# Patient Record
Sex: Female | Born: 1970 | Hispanic: Yes | Marital: Married | Smoking: Never smoker
Health system: Southern US, Community
[De-identification: ages and names within clinical notes are randomized; demographics above are authoritative.]

## PROBLEM LIST (undated history)

## (undated) HISTORY — PX: ABDOMINAL HYSTERECTOMY: SHX81

---

## 2005-12-26 ENCOUNTER — Emergency Department: Payer: Self-pay | Admitting: Unknown Physician Specialty

## 2006-10-31 ENCOUNTER — Ambulatory Visit: Payer: Self-pay | Admitting: Ophthalmology

## 2011-05-25 LAB — CBC
HCT: 38.2 % (ref 35.0–47.0)
HGB: 12.9 g/dL (ref 12.0–16.0)
MCH: 31.2 pg (ref 26.0–34.0)
RBC: 4.15 10*6/uL (ref 3.80–5.20)
WBC: 10.3 10*3/uL (ref 3.6–11.0)

## 2011-05-25 LAB — COMPREHENSIVE METABOLIC PANEL
Albumin: 4.4 g/dL (ref 3.4–5.0)
Calcium, Total: 8.3 mg/dL — ABNORMAL LOW (ref 8.5–10.1)
Chloride: 100 mmol/L (ref 98–107)
EGFR (African American): >60
Potassium: 3.3 mmol/L — ABNORMAL LOW (ref 3.5–5.1)
SGOT(AST): 22 U/L (ref 15–37)
SGPT (ALT): 16 U/L
Sodium: 137 mmol/L (ref 136–145)
Total Protein: 8.2 g/dL (ref 6.4–8.2)

## 2011-05-25 LAB — URINALYSIS, COMPLETE
Bacteria: NONE SEEN
Blood: NEGATIVE
Glucose,UR: NEGATIVE mg/dL (ref 0–75)
Ketone: NEGATIVE
Leukocyte Esterase: NEGATIVE
Ph: 7 (ref 4.5–8.0)
Protein: NEGATIVE
Specific Gravity: 1 (ref 1.003–1.030)
Squamous Epithelial: <1

## 2011-05-25 LAB — PROTIME-INR
INR: 1
Prothrombin Time: 13.1 secs (ref 11.5–14.7)

## 2011-05-25 LAB — APTT: Activated PTT: 28 secs (ref 23.6–35.9)

## 2011-05-25 LAB — TROPONIN I: Troponin-I: <0.02 ng/mL

## 2011-05-25 LAB — TSH: Thyroid Stimulating Horm: 2.37 u[IU]/mL

## 2011-05-26 ENCOUNTER — Observation Stay: Payer: Self-pay | Admitting: Internal Medicine

## 2011-05-26 LAB — BASIC METABOLIC PANEL
BUN: 7 mg/dL (ref 7–18)
Calcium, Total: 8.6 mg/dL (ref 8.5–10.1)
Chloride: 107 mmol/L (ref 98–107)
Creatinine: 0.66 mg/dL (ref 0.60–1.30)
EGFR (Non-African Amer.): >60
Glucose: 117 mg/dL — ABNORMAL HIGH (ref 65–99)
Potassium: 4 mmol/L (ref 3.5–5.1)
Sodium: 141 mmol/L (ref 136–145)

## 2011-05-26 LAB — LIPID PANEL
Cholesterol: 146 mg/dL (ref 0–200)
HDL Cholesterol: 57 mg/dL (ref 40–60)
Ldl Cholesterol, Calc: 81 mg/dL (ref 0–100)
Triglycerides: 42 mg/dL (ref 0–200)
VLDL Cholesterol, Calc: 8 mg/dL (ref 5–40)

## 2011-05-26 LAB — TROPONIN I
Troponin-I: <0.02 ng/mL
Troponin-I: <0.02 ng/mL

## 2011-05-26 LAB — MAGNESIUM: Magnesium: 2.1 mg/dL

## 2011-05-27 DIAGNOSIS — I059 Rheumatic mitral valve disease, unspecified: Secondary | ICD-10-CM

## 2014-07-27 NOTE — Consult Note (Signed)
Psychiatry: Consult received due to possibility of depression and anxiety causing or related to syncope. When I came to the room the nurse explained to me that the patient had expressed a refusal to speak to a psychiatrist. I explained through the interpretor that I had been asked to speak with her in hope I could provide some help to her and could help the medical team. The patient and the man in the room with her both said, through the interpretor, that they would only speak to me if I would not write anything in the medical record. I explained that any interaction I had would have to be documented in the record. Patient then refused to speak with me. Sorry I could not be of any more help.  Electronic Signatures: Audery Amellapacs, John T (MD)  (Signed on 21-Feb-13 16:51)  Authored  Last Updated: 21-Feb-13 16:51 by Audery Amellapacs, John T (MD)

## 2014-07-27 NOTE — Consult Note (Signed)
PATIENT NAME:  Lorraine Rios, Lorraine Rios MR#:  [id number] DATE OF BIRTH:  1970-04-16  DATE OF CONSULTATION:  05/26/2011  REFERRING PHYSICIAN:  Dr. Imogene Burnhen  CONSULTING PHYSICIAN:  Rose PhiPeter R. Kemper Durielarke, MD  HISTORY: Ms. Lorraine Rios is a 44 year old right-handed Hispanic American native of [place], patient of [facility], [employer] company inspector, with history of severe remote head injury and residual problem headaches. She was admitted 05/26/2011 and is referred for evaluation of syncope. History comes from the patient through Spanish language interpreter, and from her hospital chart.   The patient was brought to the Emergency Room at 6:30 p.m. on 05/25/2011 by EMTs summoned to the fire station where the patient was first taken from her workplace in the setting of stress for four months related to border crossing problems of her two sons in [place], and more recent concerns regarding abnormal Pap smear result of 05/16/2011 of which the patient was informed 05/20/2011, and inability to have repeat Pap smear on 05/23/2001 because of menstrual period so the test was rescheduled for March 1st, the patient reports that after starting work at 3 p.m. on 02/202/2013 at approximately 4 p.m. she started "feeling very bad" with severe headache, posterior neck pain, lightheadedness, shortness of breath, generalized weakness, chest pain, and feeling hot all over and nauseated. She hurried to the bathroom where she vomited and then sat on the commode for approximately 10 minutes. During that 10 minutes, she reports that she became aware that she was in bathroom sitting on the toilet, apparently coming to and having had a period of loss of consciousness. She found that she was not able to see and was still nauseated. She vomited a second time and then felt somewhat better, was able to see, and then got up and went to the cafeteria where she sat. She told a coworker that she had chest pain and was short of breath and needed air. The  coworker fanned her and opened the patient's blouse top, and gave her a Coke to drink before the patient was then taken to the fire station. In the ambulance ride from the fire station to the Emergency Room, the patient reports her headache was much worse and that she has little recall of that trip.   On arrival to the Emergency Room, blood pressure was 127/91 with heart rate 135, respirations 32. Oxygen saturation was 100% on room air. She had head pain rated 10 out of 10. In the hospital, her symptoms have improved including headache which she reports being much reduced when she was seen the afternoon of 05/26/2011. She endorsed continued significant feeling of being stressed.   PHYSICAL EXAMINATION: The patient is a well developed and well nourished Native American woman who was seen lying semisupine, in no acute distress, but at times noted to be mildly dysphoric and at times anxious. Cranial nerve examination was normal including eye movements and initial testing of visual fields, with pain testing, she became anxious and complained of dizziness and deferred further examination.   IMPRESSION: The picture is most consistent with episode of syncope in the setting of severe headache and nausea with emesis associated with a period of described marked stress.   RECOMMENDATIONS:  1. I agree with her present work-up and treatment in the hospital including imaging and laboratory studies.  2. Psychiatry evaluation and treatment of present mood problems with significant stress resulting in stress reaction.   I appreciate being asked to see this pleasant and interesting lady.   ____________________________ Rose PhiPeter R.  Kemper Durie, MD prc:drc D: 05/26/2011 21:31:13 ET T: 05/27/2011 07:30:18 ET JOB#: [id number]  cc: Rose Phi. Kemper Durie, MD, <Dictator> Gaspar Garbe MD ELECTRONICALLY SIGNED 05/30/2011 15:10

## 2014-07-27 NOTE — H&P (Signed)
PATIENT NAME:  Lorraine Rios, Lorraine Rios MR#:  [id number] DATE OF BIRTH:  December 04, 1970  DATE OF ADMISSION:  05/26/2011  PRIMARY CARE PHYSICIAN: None local  REFERRING PHYSICIAN: Dr. Glenetta HewMcLaurin   CHIEF COMPLAINT: Chest pain, shortness of breath, syncope today.   HISTORY OF PRESENT ILLNESS: 44 year old Spanish-speaking female with no past medical history presented to the ED with above chief complaint. Patient is alert, awake, oriented in no acute distress. She only speaks [place]. History was obtained by translator and her nephew. Patient stated that she started to have chest pain on the left side which is intermittent, 10/10 when it is on, no radiation, associated with shortness of breath, started at about 4:00 p.m. yesterday during he work. In addition she feels dizzy and passed out about 10 minutes but she denies any seizure, slurred speech or incontinence after syncope. She does complain of neck pain. She said she has depression for four months because of her son. She denies any travel history, no orthopnea, nocturnal dyspnea or leg edema.   PAST MEDICAL HISTORY: None.   PAST SURGICAL HISTORY: None.   FAMILY HISTORY: No hypertension, diabetes, stroke or heart attack or blood clot.   SOCIAL HISTORY: No smoking, alcohol drinking, or illicit drugs.   ALLERGIES: Shellfish, peanuts, eggs, fish. She uses benadryl sometimes for allergies.    REVIEW OF SYSTEMS: CONSTITUTIONAL: Patient has a headache, dizziness. No fever, chills. No weakness. ENT: No double vision, blurred vision, dysphagia, slurred speech or postnasal drip, epistaxis. CARDIOVASCULAR: Positive for chest pain, palpitation but no orthopnea, nocturnal dyspnea or edema. PULMONARY: Positive for shortness of breath. No cough, wheezing, or hematemesis. GASTROINTESTINAL: No abdominal pain, nausea, vomiting, or diarrhea. No melena, bloody stools. GENITOURINARY: No dysuria, hematuria, or incontinence. SKIN: No rash or jaundice. MUSCULOSKELETAL: No joint pain or  calf tenderness. HEMATOLOGY: No easy bruising or bleeding. PSYCH: Patient has depression and anxiety.   PHYSICAL EXAMINATION:  VITAL SIGNS: Temperature 97.9, blood pressure 113/67, pulse 86, oxygen saturation 97% on room air, respirations 16.   GENERAL: Patient is alert, awake, oriented in no acute distress.   HEENT: Pupils are round, equal, reactive to light, accommodation. Moist oral mucosa. Clear oropharynx.   NECK: Supple. No JVD or carotid bruits. No lymphadenopathy. No thyromegaly.   CARDIOVASCULAR: S1, S2 regular rate, rhythm. No murmurs, gallops.   CHEST WALL: Tenderness on the left side and left shoulder.   PULMONARY: Bilateral air entry. No wheezing or rales.   ABDOMEN: Soft. No distention or tenderness. No organomegaly. Bowel sounds present.   EXTREMITIES: No edema, clubbing, or cyanosis. No calf tenderness. Strong bilateral pedal pulses.   NEUROLOGIC: Alert and oriented x3. No focal deficit. Power 5/5. Sensation intact.   LABORATORY, DIAGNOSTIC AND RADIOLOGICAL DATA: CAT scan of head without contrast: No acute intracranial process. Chest x-ray: No acute disease. Urinalysis negative. PTT 28, glucose 95, BUN 9, creatinine 0.84, sodium 137, potassium 3.3, chloride 100, bicarbonate 25, WBC 10.3, hemoglobin 12.9, platelets 371, TSH 2.37, troponin less than 0.02, d-dimer 0.33. EKG shows sinus tachy at 123 beats per minute.   IMPRESSION:  1. Chest pain, shortness of breath and tachycardia. Need to rule out pulmonary embolism or aortic dissection.  2. Syncope, possibly due to vasovagal reaction.  3. Hypokalemia.  4. Depression.   PLAN OF TREATMENT:  1. Patient will be admitted, will be placed for observation. Will continue telemetry monitor. Follow up CT angio after premedication for desensitization with steroid, Benadryl and Zantac.  2. Follow up troponin level, lipid panel.  3. Will  give aspirin, IV fluid support.  4. Will give potassium and follow-up BMP and magnesium level.   5. GI and deep vein thrombosis prophylaxis.   Discussed patient's situation with patient and the patient's family member. All information translated by translator and patient's nephew.   TIME SPENT: About 100 minutes.   ____________________________ Shaune Pollack, MD qc:cms D: 05/25/2011 23:02:42 ET T: 05/26/2011 06:34:02 ET JOB#: [id number]  cc: Shaune Pollack, MD, <Dictator>  Shaune Pollack MD ELECTRONICALLY SIGNED 05/28/2011 14:26

## 2014-07-27 NOTE — Discharge Summary (Signed)
PATIENT NAME:  Lorraine Rios, Amirra MR#:  [id number] DATE OF BIRTH:  1970/09/03  DATE OF ADMISSION:  05/26/2011 DATE OF DISCHARGE:  05/27/2011  PRIMARY CARE PHYSICIAN: [facility], Dr Mayford KnifeWilliams.     NEUROLOGY:  Dr. Suzan SlickPeter Clarke.  PSYCHIATRY: Dr Toni Amendlapacs.    DISCHARGE DIAGNOSES:  1. Syncope likely due to vasovagal with lots of stressors/possible depression. The patient refused psychiatry evaluation although she was open to get outpatient psychologist evaluation or remaining neurological workup remained negative.  2. Hypokalemia, repleted and resolved.   SECONDARY DIAGNOSIS:  None.   CONSULTATIONS: Psychiatry, Dr. Toni Amendlapacs although the patient refused to see Psychiatry. Neurology, Dr. Suzan SlickPeter Clarke.   PROCEDURES/RADIOLOGY:  1. CT scan of the head without contrast on 05/25/2011 showed no acute intracranial process.   2. CT scan of the chest with contrast on 05/26/2011 showed no evidence of pulmonary embolism. No focal or acute abnormalities.  3. Chest x-ray on 05/25/2011 showed no acute cardiopulmonary disease.  4. A 2D echocardiogram on 05/27/2011 showed normal study. Normal LV systolic function, ejection fraction more than 55%. Mild mitral regurgitation. Normal RV systolic pressure.   MAJOR LABORATORY PANEL: Urinalysis on admission was negative.   HISTORY AND SHORT HOSPITAL COURSE: The patient is a 44 year old female with no significant medical problems, was admitted for shortness of breath, chest pain, and syncope.  Please see Dr Nicky Pughhen's dictated History and Physical for further details.  Patient was feeling dizzy even while in the hospital, for which physical therapy consult was obtained with whom she did fairly well, evaluated by Neurology, Dr. Suzan SlickPeter Clarke, who felt her spell to be more psychiatric/stress related with possible vasovagal etiology.  Psychiatry consult was placed for Dr Toni Amendlapacs although the patient refused to see Psychiatry with a fear of her being documented as being crazy. She  underwent CT scan of the head and chest which were negative. She also underwent 2D echocardiogram as she continued to feel dizzy on minimal walking yesterday. Her echocardiogram was also within normal limits.  Today she did fairly well. Did not have any further dizzy spell and is being discharged home in stable condition. Throughout her inpatient stay Spanish interpreter was requested at the bedside for all the conversation.  On the date of discharge her vital signs are as follows: Temperature 98.5, heart rate 77 per minute, respirations 16 per minute, blood pressure 114/67. She is saturating 100% on room air.   PERTINENT PHYSICAL EXAMINATION:   VITAL SIGNS: Her orthostatic vitals were negative.   CARDIOVASCULAR: S1, S2 normal. No murmur, rubs, or gallop.   LUNGS: Clear to auscultation bilaterally. No wheezing, rales, rhonchi, or crepitation.   ABDOMEN: Soft, benign.   NEUROLOGIC: Nonfocal examination.   All other physical examination remained at her baseline.   DISCHARGE MEDICATIONS:  None.    DISCHARGE ACTIVITY: As tolerated.   DISCHARGE DIET: Regular.   DISCHARGE INSTRUCTIONS AND FOLLOWUP: The patient was instructed to follow up with her primary care physician, Dr. Mayford KnifeWilliams at [facility] in 1 to 2 weeks. She will need           followup with Psychiatry or psychologist, whoever she prefers to see as an outpatient if needed.   TOTAL TIME DISCHARGING THIS PATIENT: 45 minutes.     ____________________________ Ellamae SiaVipul S. Sherryll BurgerShah, MD vss:vtd D: 05/27/2011 23:18:54 ET T: 05/28/2011 13:48:30 ET JOB#: [id number]  cc: Treshon Stannard S. Sherryll BurgerShah, MD, <Dictator> [facility] Peter R. Kemper Durielarke, MD Audery AmelJohn T. Clapacs, MD Ellamae SiaVIPUL S [facility] MD ELECTRONICALLY SIGNED 05/28/2011 19:17

## 2019-07-01 ENCOUNTER — Ambulatory Visit: Payer: Self-pay | Attending: Internal Medicine

## 2019-07-01 DIAGNOSIS — Z23 Encounter for immunization: Secondary | ICD-10-CM

## 2019-07-01 NOTE — Progress Notes (Addendum)
   Covid-19 Vaccination Clinic  Name:  Cresencia Asmus    MRN: [id number] DOB: Dec 12, 1970  07/01/2019  Ms. Deneen Harts was observed post Covid-19 immunization for 15 minutes without incident. She was provided with Vaccine Information Sheet and instruction to access the V-Safe system. Medical interpreter used.  Ms. Deneen Harts was instructed to call 911 with any severe reactions post vaccine: Marland Kitchen Difficulty breathing  . Swelling of face and throat  . A fast heartbeat  . A bad rash all over body  . Dizziness and weakness   Immunizations Administered    Name Date Dose VIS Date Route   Pfizer COVID-19 Vaccine 07/01/2019  4:13 PM 0.3 mL 03/15/2019 Intramuscular   Manufacturer: [employer], [employer]   Lot: [id number]   NDC: [address]-4

## 2019-07-27 ENCOUNTER — Ambulatory Visit: Payer: Self-pay | Attending: Internal Medicine

## 2019-07-27 DIAGNOSIS — Z23 Encounter for immunization: Secondary | ICD-10-CM

## 2019-07-27 NOTE — Progress Notes (Signed)
   Covid-19 Vaccination Clinic  Name:  Lauriana Denes    MRN: [id number] DOB: [birth year]  07/27/2019  Ms. Deneen Harts was observed post Covid-19 immunization for 15 minutes without incident. She was provided with Vaccine Information Sheet and instruction to access the V-Safe system. Meedical interpreter used. Patient reports blurred vision, this writer ask her to stay in observation for further monitoring, she reports that this happened with the 1st vaccine and it went away on its on. This Clinical research associate informed patient to seek medical help if she has any issues or concern.  Ms. Deneen Harts was instructed to call 911 with any severe reactions post vaccine: Marland Kitchen Difficulty breathing  . Swelling of face and throat  . A fast heartbeat  . A bad rash all over body  . Dizziness and weakness   Immunizations Administered    Name Date Dose VIS Date Route   Pfizer COVID-19 Vaccine 07/27/2019  4:15 PM 0.3 mL 05/29/2018 Intramuscular   Manufacturer: [employer], [employer]   Lot: [id number]   NDC: [address]-1

## 2019-09-13 ENCOUNTER — Emergency Department
Admission: EM | Admit: 2019-09-13 | Discharge: 2019-09-13 | Disposition: A | Discharge status: Home / Self Care | Payer: Self-pay | Attending: Emergency Medicine | Admitting: Emergency Medicine

## 2019-09-13 ENCOUNTER — Emergency Department: Payer: Self-pay

## 2019-09-13 ENCOUNTER — Other Ambulatory Visit: Payer: Self-pay

## 2019-09-13 ENCOUNTER — Encounter: Payer: Self-pay | Admitting: Emergency Medicine

## 2019-09-13 DIAGNOSIS — R519 Headache, unspecified: Secondary | ICD-10-CM

## 2019-09-13 DIAGNOSIS — R202 Paresthesia of skin: Secondary | ICD-10-CM

## 2019-09-13 DIAGNOSIS — R079 Chest pain, unspecified: Secondary | ICD-10-CM

## 2019-09-13 DIAGNOSIS — R0789 Other chest pain: Secondary | ICD-10-CM | POA: Insufficient documentation

## 2019-09-13 LAB — CBC
HCT: 42.4 % (ref 36.0–46.0)
Hemoglobin: 14.4 g/dL (ref 12.0–15.0)
MCH: 31.2 pg (ref 26.0–34.0)
MCHC: 34 g/dL (ref 30.0–36.0)
MCV: 91.8 fL (ref 80.0–100.0)
Platelets: 402 10*3/uL — ABNORMAL HIGH (ref 150–400)
RBC: 4.62 MIL/uL (ref 3.87–5.11)
RDW: 12 % (ref 11.5–15.5)
WBC: 12.2 10*3/uL — ABNORMAL HIGH (ref 4.0–10.5)
nRBC: 0 % (ref 0.0–0.2)

## 2019-09-13 LAB — BASIC METABOLIC PANEL
Anion gap: 11 (ref 5–15)
BUN: 11 mg/dL (ref 6–20)
CO2: 22 mmol/L (ref 22–32)
Calcium: 8.8 mg/dL — ABNORMAL LOW (ref 8.9–10.3)
Chloride: 101 mmol/L (ref 98–111)
Creatinine, Ser: 0.84 mg/dL (ref 0.44–1.00)
GFR calc Af Amer: >60 mL/min (ref >60)
GFR calc non Af Amer: >60 mL/min (ref >60)
Glucose, Bld: 99 mg/dL (ref 70–99)
Potassium: 3.2 mmol/L — ABNORMAL LOW (ref 3.5–5.1)
Sodium: 134 mmol/L — ABNORMAL LOW (ref 135–145)

## 2019-09-13 LAB — TROPONIN I (HIGH SENSITIVITY)
Troponin I (High Sensitivity): <2 ng/L (ref <18)
Troponin I (High Sensitivity): 3 ng/L (ref <18)

## 2019-09-13 MED ORDER — ONDANSETRON HCL 4 MG/2ML IJ SOLN
4.0000 mg | Freq: Once | INTRAMUSCULAR | Status: AC
  Administered 2019-09-13: 4 mg via INTRAVENOUS
  Filled 2019-09-13: qty 2

## 2019-09-13 MED ORDER — IOHEXOL 350 MG/ML SOLN
75.0000 mL | Freq: Once | INTRAVENOUS | Status: AC | PRN
  Administered 2019-09-13: 75 mL via INTRAVENOUS

## 2019-09-13 MED ORDER — BUTALBITAL-APAP-CAFFEINE 50-325-40 MG PO TABS: 1.0000 | ORAL_TABLET | Freq: Four times a day (QID) | ORAL | 0 refills | Status: AC | PRN

## 2019-09-13 MED ORDER — SODIUM CHLORIDE 0.9% FLUSH: 3.0000 mL | Freq: Once | INTRAVENOUS | Status: DC

## 2019-09-13 MED ORDER — MORPHINE SULFATE (PF) 4 MG/ML IV SOLN
4.0000 mg | Freq: Once | INTRAVENOUS | Status: AC
  Administered 2019-09-13: 4 mg via INTRAVENOUS
  Filled 2019-09-13: qty 1

## 2019-09-13 MED ORDER — SODIUM CHLORIDE 0.9 % IV BOLUS
1000.0000 mL | Freq: Once | INTRAVENOUS | Status: AC
  Administered 2019-09-13: 1000 mL via INTRAVENOUS

## 2019-09-13 NOTE — ED Notes (Signed)
Patient given water

## 2019-09-13 NOTE — ED Provider Notes (Signed)
[facility] Emergency Department Provider Note  Time seen: 12:45 PM  I have reviewed the triage vital signs and the nursing notes. Spanish interpreter used for this evaluation.  HISTORY  Chief Complaint Chest Pain   HPI Lorraine Rios is a 49 y.o. female with no significant past medical history presents to the emergency department with complaints of chest pain and a headache.  According to the patient she was at work when around 8 AM this morning she developed chest pain and a headache.  Patient states some pain and tingling in both of her arms as well but somewhat more so in the left arm per patient.  Patient denies any significant history of headaches previously.  Currently rates her headache is moderate, chest pain is moderate.  Denies any worsening with deep inspiration.  No cough.  Does state slight shortness of breath and vomited twice upon arrival to the emergency department.   History reviewed. No pertinent past medical history.  There are no problems to display for this patient.   Past Surgical History:  Procedure Laterality Date  . ABDOMINAL HYSTERECTOMY      Prior to Admission medications   Not on File    Allergies  Allergen Reactions  . Penicillins Hives, Rash and Swelling    No family history on file.  Social History Social History   Tobacco Use  . Smoking status: Never Smoker  . Smokeless tobacco: Never Used  Substance Use Topics  . Alcohol use: Not Currently  . Drug use: Not on file    Review of Systems Constitutional: Negative for fever. Cardiovascular: Negative for chest pain. Respiratory: Negative for shortness of breath. Gastrointestinal: Negative for abdominal pain Musculoskeletal: Negative for musculoskeletal complaints Neurological: Negative for headache All other ROS negative  ____________________________________________   PHYSICAL EXAM:  VITAL SIGNS: ED Triage Vitals  Enc Vitals Group     BP 09/13/19  0908 (!) 151/110     Pulse Rate 09/13/19 0908 (!) 108     Resp 09/13/19 0908 20     Temp 09/13/19 0908 98.2 F (36.8 C)     Temp Source 09/13/19 0908 Oral     SpO2 09/13/19 0908 99 %     Weight 09/13/19 0910 150 lb (68 kg)     Height 09/13/19 0910 5' (1.524 m)     Head Circumference --      Peak Flow --      Pain Score 09/13/19 0910 10     Pain Loc --      Pain Edu? --      Excl. in [place]? --    Constitutional: Alert and oriented. Well appearing and in no distress. Eyes: Normal exam ENT      Head: Normocephalic and atraumatic.      Mouth/Throat: Mucous membranes are moist. Cardiovascular: Normal rate, regular rhythm.  Respiratory: Normal respiratory effort without tachypnea nor retractions. Breath sounds are clear Gastrointestinal: Soft and nontender. No distention.  Musculoskeletal: Nontender with normal range of motion in all extremities. No lower extremity tenderness or edema.  Chest wall is tender to palpation, reproducible chest pain. Neurologic:  Normal speech and language. No gross focal neurologic deficits equal grip strength bilaterally. Skin:  Skin is warm, dry and intact.  Psychiatric: Mood and affect are normal.   ____________________________________________    EKG  EKG viewed and interpreted by myself shows a normal sinus rhythm at 98 bpm with a narrow QRS, normal axis, normal intervals, no concerning ST changes.  ____________________________________________    RADIOLOGY  Chest x-ray is negative   CT head and chest are negative for acute abnormality. ____________________________________________   INITIAL IMPRESSION / ASSESSMENT AND PLAN / ED COURSE  Pertinent labs & imaging results that were available during my care of the patient were reviewed by me and considered in my medical decision making (see chart for details).   Patient presents to the emergency department for chest pain and a headache starting around 8 AM this morning.  Patient continues a  moderate chest pain a moderate headache.  Also states tingling and discomfort in both of her arms.  Patient is somewhat anxious in appearance.  Chest pain is very reproducible on exam with mild palpation.  Patient's lab work is within normal limits.  However given the patient's chest pain with headache we will obtain CT scan of the head and a CTA of the chest as a precaution.  Patient will require a repeat troponin as well.  We will treat pain and nausea while awaiting results.  Patient agreeable to plan of care.  CT scan of the head and chest are negative for acute abnormality.  Patient appears well.  No distress.  She states her headache and chest pain are largely resolved but she states she is having numbness to the left arm.  Spoke to her at length patient states she still is having trouble feeling her left arm.  Has great strength in left arm on exam.  No cranial nerve deficits.  However given the patient's complaint of left arm numbness we will proceed with MR brain.  MRI is negative.  Patient will be discharged home.  Lorraine Rios was evaluated in Emergency Department on 09/13/2019 for the symptoms described in the history of present illness. She was evaluated in the context of the global COVID-19 pandemic, which necessitated consideration that the patient might be at risk for infection with the SARS-CoV-2 virus that causes COVID-19. Institutional protocols and algorithms that pertain to the evaluation of patients at risk for COVID-19 are in a state of rapid change based on information released by regulatory bodies including the CDC and federal and state organizations. These policies and algorithms were followed during the patient's care in the ED.  ____________________________________________   FINAL CLINICAL IMPRESSION(S) / ED DIAGNOSES  Chest pain Headache Paresthesias   Minna Antis, MD 09/13/19 1519

## 2019-09-13 NOTE — ED Triage Notes (Signed)
Says she was at work and had sudden onset left side chest pain. She is tearful, coming down in chair and wincing.  She describes the pain as awful.  Difficult to get her to stay still during ekg.  Used interpretter on stick.

## 2019-09-20 ENCOUNTER — Encounter: Payer: Self-pay | Admitting: Cardiology

## 2019-09-20 ENCOUNTER — Ambulatory Visit (INDEPENDENT_AMBULATORY_CARE_PROVIDER_SITE_OTHER): Payer: Self-pay | Admitting: Cardiology

## 2019-09-20 ENCOUNTER — Other Ambulatory Visit: Payer: Self-pay

## 2019-09-20 VITALS — BP 120/70 | HR 75 | Ht 63.0 in | Wt 139.0 lb

## 2019-09-20 DIAGNOSIS — R079 Chest pain, unspecified: Secondary | ICD-10-CM

## 2019-09-20 NOTE — Progress Notes (Signed)
Cardiology Office Note:    Date:  09/20/2019   ID:  Lorraine Rios, DOB January 23, 1971, MRN [id number]  PCP:  Preston Fleeting, MD  [facility] Cardiologist:  Debbe Odea, MD  [facility] Electrophysiologist:  None   Referring MD: Minna Antis, MD   Chief Complaint  Patient presents with  . OTHER    F/u ED chest pain c/o left arm numbness and cp. Meds reviewed verbally with pt.    History of Present Illness:    Lorraine Rios is a 49 y.o. female with no significant past medical history who presents due to chest pain.  Patient states having symptoms of sharp chest pain 7 days ago which prompted her to present to the ED.  Chest discomfort was associated with a headache and left arm numbness/heaviness.  Symptoms occurred while she was at work.  Not related with exertion.  Whenever she gets chest pain, she has some shortness of breath.  Symptoms also were associated with tingling in both arms.  EKG and troponins were unremarkable.  Chest pain was reproducible on palpation according to ED report.  Head CT and chest CTA were negative for any acute abnormality.  MRI was negative discharged home.  Her left arm numbness and heaviness have stayed roughly the same since.  Her chest pain symptoms come and go.  She denies any history of heart disease such as MI, heart failure, hypertension.  She has upcoming appointment to see neurology due to left arm tingling.  History reviewed. No pertinent past medical history.  Past Surgical History:  Procedure Laterality Date  . ABDOMINAL HYSTERECTOMY      Current Medications: Current Meds  Medication Sig  . butalbital-acetaminophen-caffeine (FIORICET) 50-325-40 MG tablet Take 1-2 tablets by mouth every 6 (six) hours as needed for headache.  . ibuprofen (ADVIL) 800 MG tablet Take by mouth.     Allergies:   Penicillins   Social History   Socioeconomic History  . Marital status: Married    Spouse name: Not on file  .  Number of children: Not on file  . Years of education: Not on file  . Highest education level: Not on file  Occupational History  . Not on file  Tobacco Use  . Smoking status: Never Smoker  . Smokeless tobacco: Never Used  Vaping Use  . Vaping Use: Never used  Substance and Sexual Activity  . Alcohol use: Not Currently  . Drug use: Never  . Sexual activity: Not on file  Other Topics Concern  . Not on file  Social History Narrative  . Not on file   Social Determinants of Health   Financial Resource Strain:   . Difficulty of Paying Living Expenses:   Food Insecurity:   . Worried About Programme researcher, broadcasting/film/video in the Last Year:   . Barista in the Last Year:   Transportation Needs:   . Freight forwarder (Medical):   Marland Kitchen Lack of Transportation (Non-Medical):   Physical Activity:   . Days of Exercise per Week:   . Minutes of Exercise per Session:   Stress:   . Feeling of Stress :   Social Connections:   . Frequency of Communication with Friends and Family:   . Frequency of Social Gatherings with Friends and Family:   . Attends Religious Services:   . Active Member of Clubs or Organizations:   . Attends Banker Meetings:   Marland Kitchen Marital Status:      Family  History: The patient's family history is not on file.  ROS:   Please see the history of present illness.     All other systems reviewed and are negative.  EKGs/Labs/Other Studies Reviewed:    The following studies were reviewed today:   EKG:  EKG is  ordered today.  The ekg ordered today demonstrates normal sinus rhythm, normal ECG.  Recent Labs: 09/13/2019: BUN 11; Creatinine, Ser 0.84; Hemoglobin 14.4; Platelets 402; Potassium 3.2; Sodium 134  Recent Lipid Panel    Component Value Date/Time   CHOL 146 05/26/2011 0451   TRIG 42 05/26/2011 0451   HDL 57 05/26/2011 0451   VLDL 8 05/26/2011 0451   LDLCALC 81 05/26/2011 0451    Physical Exam:    VS:  BP 120/70 (BP Location: Right Arm,  Patient Position: Sitting, Cuff Size: Normal)   Pulse 75   Ht 5\' 3"  (1.6 m)   Wt 139 lb (63 kg)   SpO2 98%   BMI 24.62 kg/m     Wt Readings from Last 3 Encounters:  09/20/19 139 lb (63 kg)  09/13/19 150 lb (68 kg)     GEN:  Well nourished, well developed in no acute distress HEENT: Normal NECK: No JVD; No carotid bruits LYMPHATICS: No lymphadenopathy CARDIAC: RRR, no murmurs, rubs, gallops RESPIRATORY:  Clear to auscultation without rales, wheezing or rhonchi  ABDOMEN: Soft, non-tender, non-distended MUSCULOSKELETAL:  No edema; No deformity  SKIN: Warm and dry NEUROLOGIC:  Alert and oriented x 3 PSYCHIATRIC:  Normal affect   ASSESSMENT:    1. Chest pain of uncertain etiology    PLAN:    In order of problems listed above:  1. Patient with atypical chest pain.  Symptoms are reproducible with palpation of the mid sternum and sternal borders.  Symptoms consistent with musculoskeletal etiology and not cardiac.  EKG and troponins in the ED were normal.  Recommend management for musculoskeletal etiology.  Trial of ibuprofen is reasonable.  If symptoms persist, recommend following up with primary care provider for additional input and management strategy.  Encourage patient to keep appointment with neurology regarding left arm numbness and tingling.  Follow-up as needed  This note was generated in part or whole with voice recognition software. Voice recognition is usually quite accurate but there are transcription errors that can and very often do occur. I apologize for any typographical errors that were not detected and corrected.  Spanish interpreter used for this encounter.    Medication Adjustments/Labs and Tests Ordered: Current medicines are reviewed at length with the patient today.  Concerns regarding medicines are outlined above.  Orders Placed This Encounter  Procedures  . EKG 12-Lead   No orders of the defined types were placed in this encounter.   Patient  Instructions  Medication Instructions:  No Changes *If you need a refill on your cardiac medications before your next appointment, please call your pharmacy*   Lab Work: None Ordered If you have labs (blood work) drawn today and your tests are completely normal, you will receive your results only by: Marland Kitchen MyChart Message (if you have MyChart) OR . A paper copy in the mail If you have any lab test that is abnormal or we need to change your treatment, we will call you to review the results.   Testing/Procedures: None Ordered   Follow-Up: At [facility], you and your health needs are our priority.  As part of our continuing mission to provide you with exceptional heart care, we have created  designated Provider Care Teams.  These Care Teams include your primary Cardiologist (physician) and Advanced Practice Providers (APPs -  Physician Assistants and Nurse Practitioners) who all work together to provide you with the care you need, when you need it.  We recommend signing up for the patient portal called "MyChart".  Sign up information is provided on this After Visit Summary.  MyChart is used to connect with patients for Virtual Visits (Telemedicine).  Patients are able to view lab/test results, encounter notes, upcoming appointments, etc.  Non-urgent messages can be sent to your provider as well.   To learn more about what you can do with MyChart, go to [email or web address].    Your next appointment:   As needed   The format for your next appointment:   In Person  Provider:   Debbe Odea, MD   Other Instructions N/A     Signed, Debbe Odea, MD  09/20/2019 12:38 PM    [facility]

## 2019-09-20 NOTE — Patient Instructions (Signed)
Medication Instructions:  No Changes *If you need a refill on your cardiac medications before your next appointment, please call your pharmacy*   Lab Work: None Ordered If you have labs (blood work) drawn today and your tests are completely normal, you will receive your results only by: Marland Kitchen MyChart Message (if you have MyChart) OR . A paper copy in the mail If you have any lab test that is abnormal or we need to change your treatment, we will call you to review the results.   Testing/Procedures: None Ordered   Follow-Up: At [facility], you and your health needs are our priority.  As part of our continuing mission to provide you with exceptional heart care, we have created designated Provider Care Teams.  These Care Teams include your primary Cardiologist (physician) and Advanced Practice Providers (APPs -  Physician Assistants and Nurse Practitioners) who all work together to provide you with the care you need, when you need it.  We recommend signing up for the patient portal called "MyChart".  Sign up information is provided on this After Visit Summary.  MyChart is used to connect with patients for Virtual Visits (Telemedicine).  Patients are able to view lab/test results, encounter notes, upcoming appointments, etc.  Non-urgent messages can be sent to your provider as well.   To learn more about what you can do with MyChart, go to [email or web address].    Your next appointment:   As needed   The format for your next appointment:   In Person  Provider:   Debbe Odea, MD   Other Instructions N/A

## 2022-01-05 IMAGING — MR MR HEAD W/O CM
11 series · 48 of 48 positions shown · non-contrast
Comparison: Head CT today, and earlier.

CLINICAL DATA: 48-year-old female with headache, left chest and arm
pain.

EXAM:
MRI HEAD WITHOUT CONTRAST
TECHNIQUE: Multiplanar, multiecho pulse sequences of the brain and surrounding
structures were obtained without intravenous contrast.

[Series 2: ax dwi_tracew · axial · 3.0mm · 1.31mm/px · z∈[-120,+36]mm · 6 of 48 slices shown]
[im 1/48]
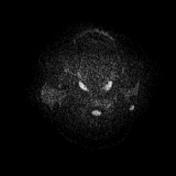
[im 10/48]
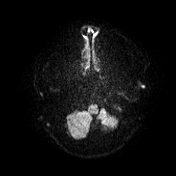
[im 19/48]
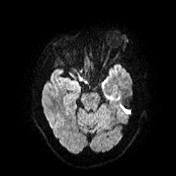
[im 29/48]
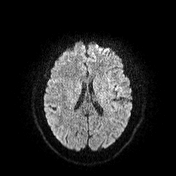
[im 38/48]
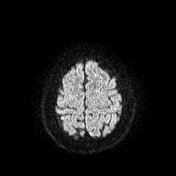
[im 48/48]
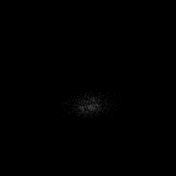

[Series 3: ax dwi_adc · axial · 3.0mm · 1.31mm/px · z∈[-120,+36]mm · 5 of 48 slices shown]
[im 1/48]
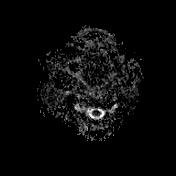
[im 12/48]
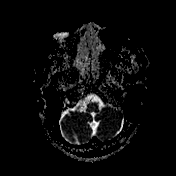
[im 24/48]
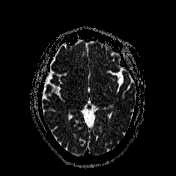
[im 36/48]
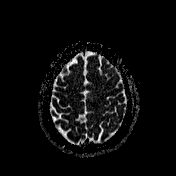
[im 48/48]
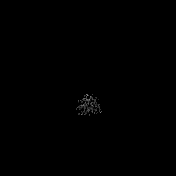

[Series 4: cor dwi_tracew · coronal · 5.0mm · 1.31mm/px · 4 of 38 slices shown]
[im 1/38]
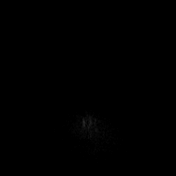
[im 13/38]
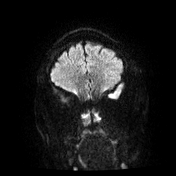
[im 25/38]
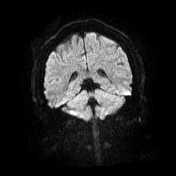
[im 38/38]
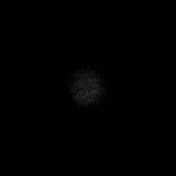

[Series 5: cor dwi_adc · coronal · 5.0mm · 1.31mm/px · 4 of 38 slices shown]
[im 1/38]
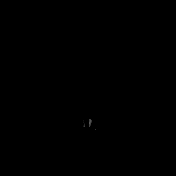
[im 13/38]
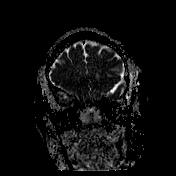
[im 25/38]
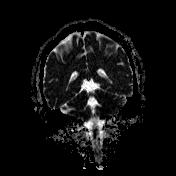
[im 38/38]
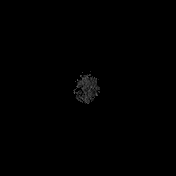

[Series 6: T1 · sagittal · 5.0mm · 0.94mm/px · 2 of 21 slices shown (1 of 2)]
[im 1/21]
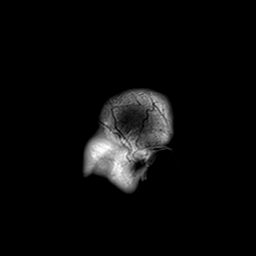
[im 21/21]
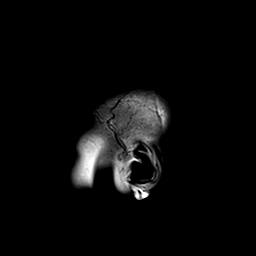

[Series 7: T2 · axial · 5.0mm · 0.45mm/px · z∈[-120,+36]mm · 3 of 27 slices shown (1 of 2)]
[im 1/27]
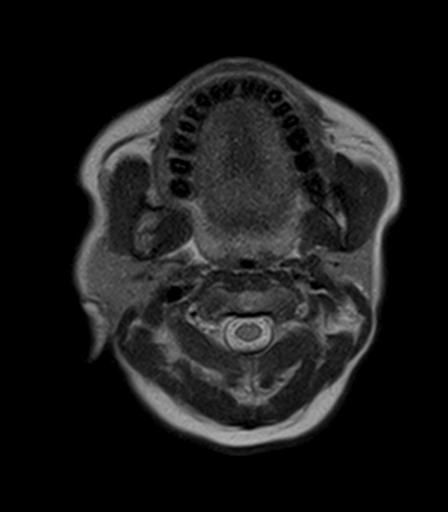
[im 14/27]
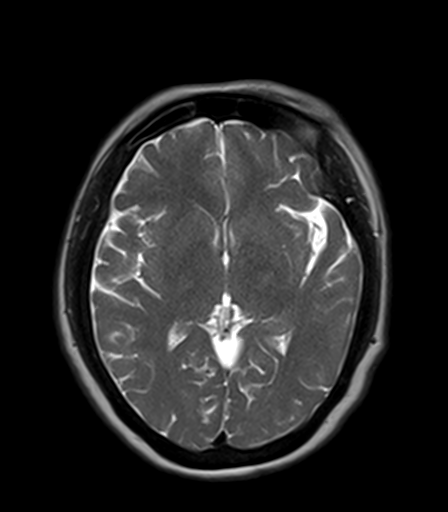
[im 27/27]
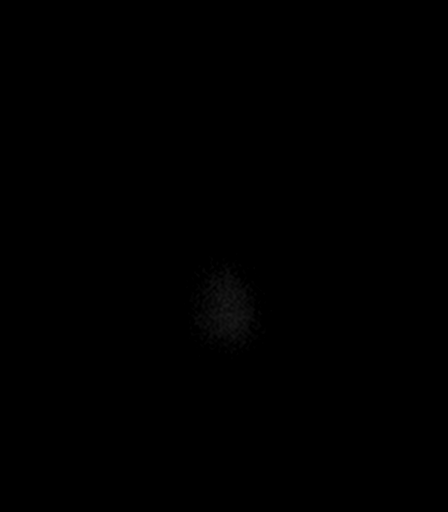

[Series 9: pha_images · axial · 3.0mm · 0.90mm/px · z∈[-118,+28]mm · 6 of 50 slices shown]
[im 1/50]
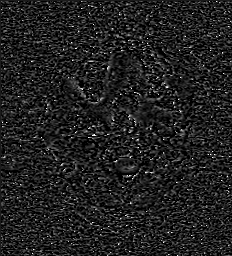
[im 10/50]
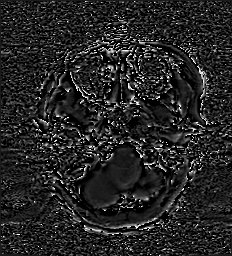
[im 20/50]
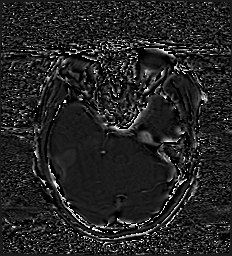
[im 30/50]
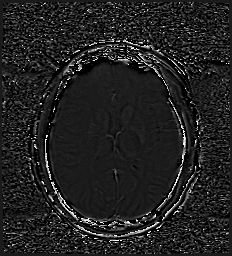
[im 40/50]
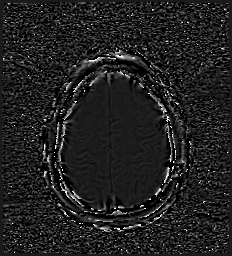
[im 50/50]
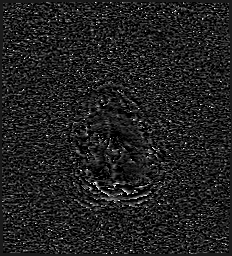

[Series 10: swi_images · axial · 3.0mm · 0.90mm/px · z∈[-118,+34]mm · 6 of 52 slices shown]
[im 1/52]
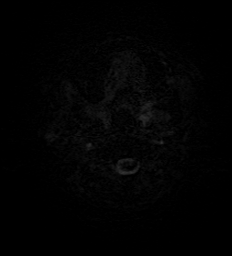
[im 11/52]
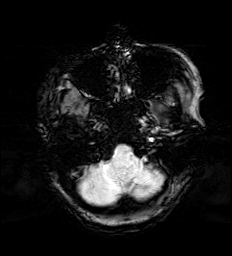
[im 21/52]
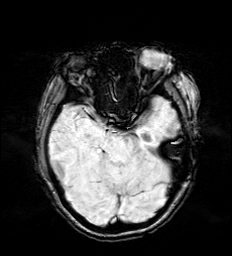
[im 31/52]
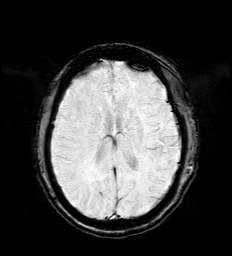
[im 41/52]
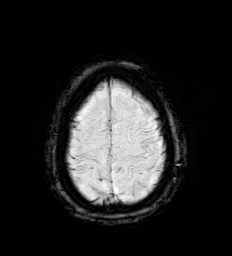
[im 52/52]
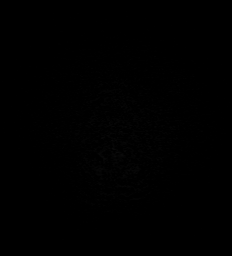

[Series 12: FLAIR · axial · 3.0mm · 0.53mm/px · z∈[-120,+36]mm · 6 of 53 slices shown]
[im 1/53]
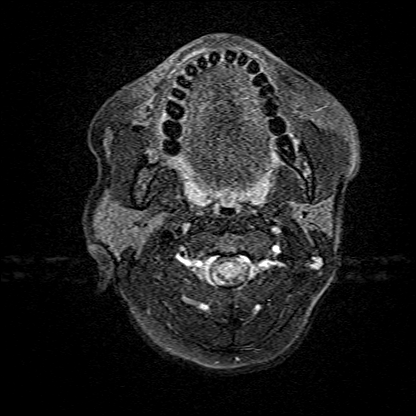
[im 11/53]
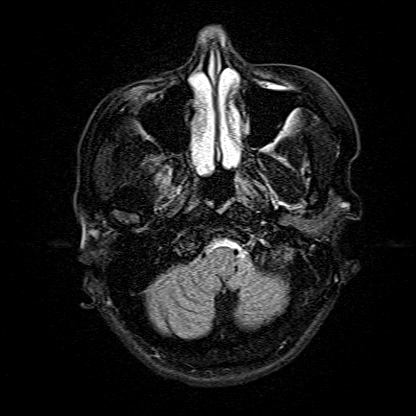
[im 21/53]
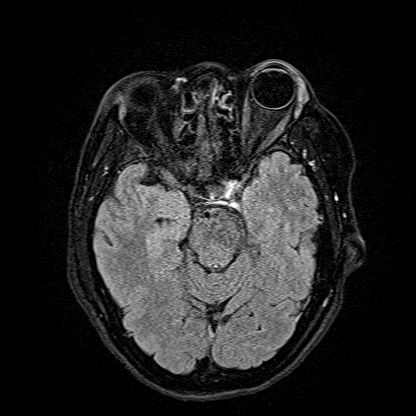
[im 32/53]
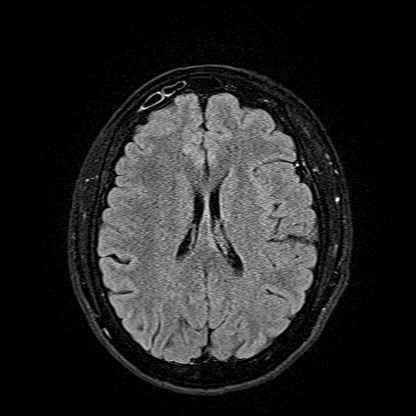
[im 42/53]
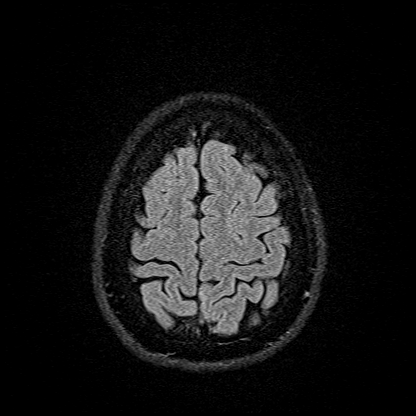
[im 53/53]
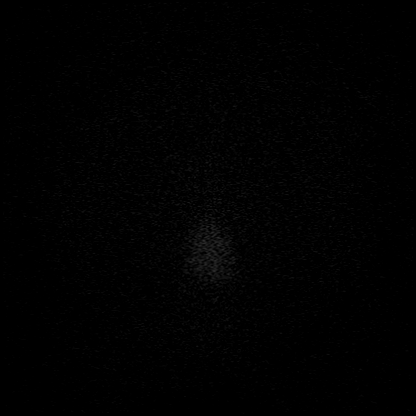

[Series 13: T1 · axial · 5.0mm · 0.90mm/px · z∈[-120,+36]mm · 3 of 27 slices shown (2 of 2)]
[im 1/27]
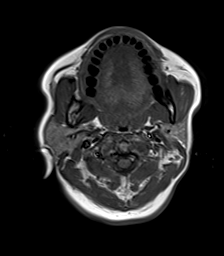
[im 14/27]
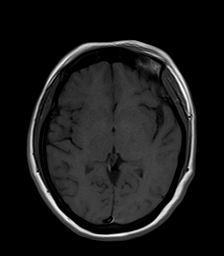
[im 27/27]
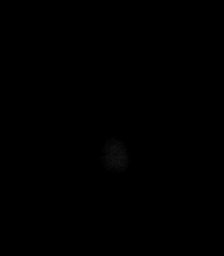

[Series 14: T2 · coronal · 5.0mm · 0.45mm/px · 3 of 31 slices shown (2 of 2)]
[im 1/31]
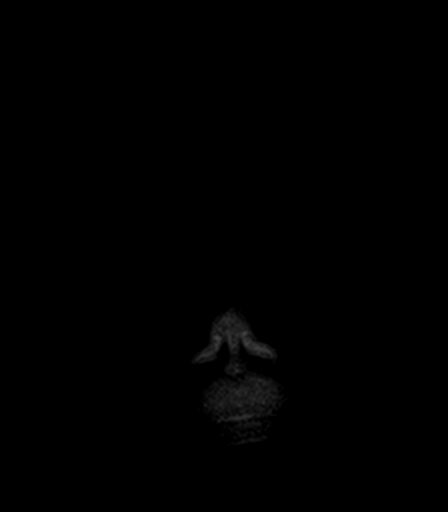
[im 16/31]
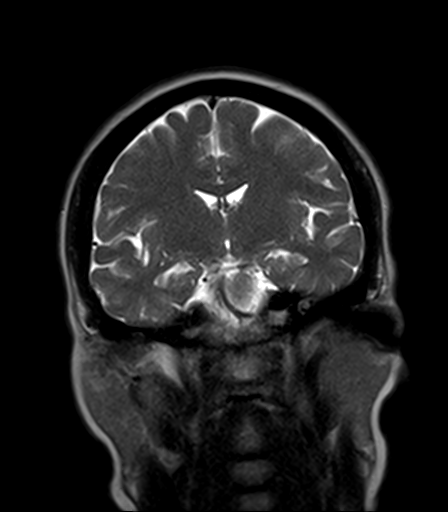
[im 31/31]
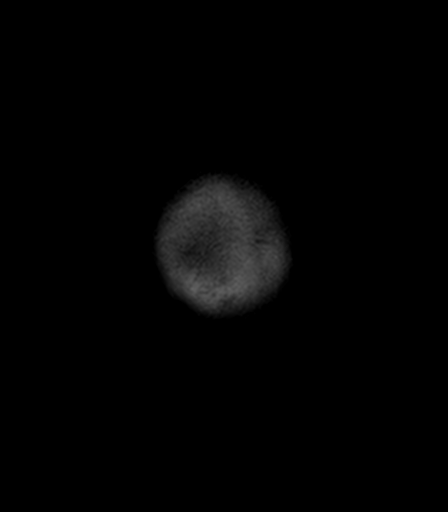

[48 of 48 positions shown; findings below may reference images not displayed]

FINDINGS: Brain: No restricted diffusion to suggest acute infarction. No
midline shift, mass effect, evidence of mass lesion,
ventriculomegaly, extra-axial collection or acute intracranial
hemorrhage. Cervicomedullary junction and pituitary are within
normal limits. Normal cerebral volume. Gray and white matter signal
is within normal limits throughout the brain. No encephalomalacia or
chronic blood products identified.

Vascular: Major intracranial vascular flow voids are preserved.

Skull and upper cervical spine: Negative for age visible cervical
spine. Visualized bone marrow signal is within normal limits.

Sinuses/Orbits: Negative orbits. Mild paranasal sinus mucosal
thickening.

Other: Mastoids are clear. Grossly normal visible internal auditory
structures. Scalp and face soft tissues appear negative.
IMPRESSION: Normal noncontrast MRI appearance of the brain.
